# Patient Record
Sex: Female | Born: 1945 | Race: White | Hispanic: No | Smoking: Never smoker
Health system: Southern US, Community
[De-identification: ages and names within clinical notes are randomized; demographics above are authoritative.]

## PROBLEM LIST (undated history)

## (undated) DIAGNOSIS — I10 Essential (primary) hypertension: Secondary | ICD-10-CM

## (undated) DIAGNOSIS — E669 Obesity, unspecified: Secondary | ICD-10-CM

---

## 2014-11-15 ENCOUNTER — Other Ambulatory Visit: Payer: Self-pay | Admitting: Internal Medicine

## 2014-11-15 DIAGNOSIS — Z1231 Encounter for screening mammogram for malignant neoplasm of breast: Secondary | ICD-10-CM

## 2015-01-25 ENCOUNTER — Ambulatory Visit
Admission: RE | Admit: 2015-01-25 | Discharge: 2015-01-25 | Disposition: A | Discharge status: Home / Self Care | Payer: Medicare Other | Source: Ambulatory Visit | Attending: Internal Medicine | Admitting: Internal Medicine

## 2015-01-25 DIAGNOSIS — Z1231 Encounter for screening mammogram for malignant neoplasm of breast: Secondary | ICD-10-CM

## 2015-09-24 ENCOUNTER — Encounter (HOSPITAL_COMMUNITY): Payer: Self-pay | Admitting: *Deleted

## 2015-09-24 ENCOUNTER — Emergency Department (HOSPITAL_COMMUNITY)
Admission: EM | Admit: 2015-09-24 | Discharge: 2015-09-24 | Disposition: A | Discharge status: Home / Self Care | Payer: Medicare Other | Attending: Emergency Medicine | Admitting: Emergency Medicine

## 2015-09-24 DIAGNOSIS — I10 Essential (primary) hypertension: Secondary | ICD-10-CM | POA: Insufficient documentation

## 2015-09-24 DIAGNOSIS — M79671 Pain in right foot: Secondary | ICD-10-CM | POA: Diagnosis not present

## 2015-09-24 HISTORY — DX: Essential (primary) hypertension: I10

## 2015-09-24 HISTORY — DX: Obesity, unspecified: E66.9

## 2015-09-24 MED ORDER — NAPROXEN 375 MG PO TABS: 375.0000 mg | ORAL_TABLET | Freq: Two times a day (BID) | ORAL | Status: AC | PRN

## 2015-09-24 MED ORDER — KETOROLAC TROMETHAMINE 60 MG/2ML IM SOLN
30.0000 mg | Freq: Once | INTRAMUSCULAR | Status: AC
  Administered 2015-09-24: 30 mg via INTRAMUSCULAR
  Filled 2015-09-24: qty 2

## 2015-09-24 NOTE — ED Notes (Signed)
Pt reports right foot and ankle pain for over a year. Denies injury. No obv swelling or redness noted.

## 2015-09-24 NOTE — ED Notes (Signed)
Declined W/C at D/C and was escorted to lobby by RN. 

## 2015-09-24 NOTE — Discharge Instructions (Signed)
You have been seen today for foot pain. Follow-up with orthopedics as soon as possible. Call the number provided to set up an appointment. Use the postop shoe as needed for comfort. Use naproxen or ibuprofen for pain and inflammation. Follow up with PCP as needed.

## 2015-09-24 NOTE — ED Provider Notes (Signed)
CSN: [id number]     Arrival date & time 09/24/15  1603 History  By signing my name below, I, Wendy Hull, attest that this documentation has been prepared under the direction and in the presence of Quintyn Dombek, PA-C. Electronically Signed: Phillis HaggisGabriella Hull, ED Scribe. 09/24/2015. 4:27 PM.   Chief Complaint  Patient presents with  . Foot Pain   The history is provided by the patient. No language interpreter was used.  HPI Comments: Wendy Hull is a 70 y.o. female with a hx of arthritis and HTN who presents to the Emergency Department complaining of gradually worsening right foot pain onset one week ago. She reports worsening pain with weightbearing and palpation. Pt has a hx of similar symptoms occurring over the last year. She was diagnosed with arthritis and told to use Tylenol/Ibuprofen. She states that neither medication has brought her relief. Pt has not seen an orthopedist. She is regularly ambulatory with a cane. She denies hx of DM, hx of kidney disease, injury to the area, joint swelling, color change, wound, neuro deficits, or any other complaints.   Past Medical History  Diagnosis Date  . Obesity   . Hypertension    History reviewed. No pertinent past surgical history. History reviewed. No pertinent family history. Social History  Substance Use Topics  . Smoking status: Never Smoker   . Smokeless tobacco: None  . Alcohol Use: No   OB History    No data available     Review of Systems  Musculoskeletal: Positive for arthralgias. Negative for joint swelling.  Skin: Negative for color change and wound.  Neurological: Negative for weakness and numbness.   Allergies  Penicillins and Tetanus toxoids  Home Medications   Prior to Admission medications   Medication Sig Start Date End Date Taking? Authorizing Provider  naproxen (NAPROSYN) 375 MG tablet Take 1 tablet (375 mg total) by mouth 2 (two) times daily as needed for mild pain or moderate pain. 09/24/15   Shreeya Recendiz C Rulon Abdalla,  PA-C   BP 127/77 mmHg  Pulse 108  Temp(Src) 98.4 F (36.9 C) (Oral)  Resp 22  SpO2 97% Physical Exam  Constitutional: She is oriented to person, place, and time. She appears well-developed and well-nourished. No distress.  HENT:  Head: Normocephalic and atraumatic.  Eyes: Conjunctivae are normal.  Neck: Normal range of motion. Neck supple.  Cardiovascular: Normal rate, regular rhythm and intact distal pulses.   Pulmonary/Chest: Effort normal.  Musculoskeletal: Normal range of motion. She exhibits tenderness. She exhibits no edema.  Tenderness to the right lateral mid foot and plantar surface midfoot fascia. No discernable swelling or deformity.  Neurological: She is alert and oriented to person, place, and time.  No sensory deficits. Strength 5/5 in all extremities. No gait disturbance beyond pt's normal (patient uses a cane at baseline). Coordination intact.   Skin: Skin is warm and dry. She is not diaphoretic.  Psychiatric: She has a normal mood and affect. Her behavior is normal.  Nursing note and vitals reviewed.   ED Course  Procedures (including critical care time) DIAGNOSTIC STUDIES: Oxygen Saturation is 97% on RA, normal by my interpretation.    COORDINATION OF CARE: 4:26 PM-Discussed treatment plan which includes anti-inflammatories with pt at bedside and pt agreed to plan.     MDM    Final diagnoses:  Right foot pain    Wendy Hull presents with chronic right foot pain recurring over the last year.  Suspect arthritis versus plantar fasciitis. I believe weight plays a  large factor in this patient's discomfort. Pain managed in ED. Pt advised to follow up with orthopedics if symptoms persist. No indication for emergent imaging. Pt will be given post-op shoe for ambulation. Conservative therapy and anti-inflammatories recommended and discussed. Patient will be dc home & is agreeable with above plan.  Filed Vitals:   09/24/15 1608 09/24/15 1657  BP: 127/77  136/76  Pulse: 108 84  Temp: 98.4 F (36.9 C) 98.8 F (37.1 C)  TempSrc: Oral Oral  Resp: 22 18  SpO2: 97% 100%     I personally performed the services described in this documentation, which was scribed in my presence. The recorded information has been reviewed and is accurate.   Anselm PancoastShawn C Davita Sublett, PA-C 09/24/15 1700  Glynn OctaveStephen Rancour, MD 09/24/15 [phone]

## 2015-12-29 ENCOUNTER — Other Ambulatory Visit: Payer: Self-pay | Admitting: Internal Medicine

## 2015-12-29 DIAGNOSIS — Z1231 Encounter for screening mammogram for malignant neoplasm of breast: Secondary | ICD-10-CM

## 2016-01-14 ENCOUNTER — Encounter (HOSPITAL_COMMUNITY): Payer: Self-pay

## 2016-01-14 ENCOUNTER — Emergency Department (HOSPITAL_COMMUNITY)
Admission: EM | Admit: 2016-01-14 | Discharge: 2016-01-14 | Disposition: A | Discharge status: Home / Self Care | Payer: Medicare Other | Attending: Emergency Medicine | Admitting: Emergency Medicine

## 2016-01-14 ENCOUNTER — Emergency Department (HOSPITAL_COMMUNITY): Payer: Medicare Other

## 2016-01-14 DIAGNOSIS — I10 Essential (primary) hypertension: Secondary | ICD-10-CM | POA: Insufficient documentation

## 2016-01-14 DIAGNOSIS — M79671 Pain in right foot: Secondary | ICD-10-CM | POA: Insufficient documentation

## 2016-01-14 MED ORDER — IBUPROFEN 600 MG PO TABS: 600.0000 mg | ORAL_TABLET | Freq: Four times a day (QID) | ORAL | 0 refills | Status: AC | PRN

## 2016-01-14 NOTE — ED Notes (Signed)
Declined W/C at D/C and was escorted to lobby by RN. 

## 2016-01-14 NOTE — ED Triage Notes (Signed)
Pt reports right foot pain X month. Pt reports unable to walk on the foot. Pt not diabetic. Strong pedal pulse and no swelling noted.

## 2016-01-14 NOTE — Discharge Instructions (Signed)
Take your medication as prescribed as have for pain relief. I also recommend resting, elevating and applying ice to her right foot for 15 minutes 3-4 times daily for additional pain relief. 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 Please return to the Emergency Department if symptoms worsen or new onset of fever, redness, swelling, warmth, numbness, tingling, weakness, leg swelling, unable to ambulate.

## 2016-01-14 NOTE — ED Provider Notes (Signed)
[facility]-EMERGENCY DEPT Provider Note   CSN: [id number] Arrival date & time: 01/14/16  1220    By signing my name below, I, Clarisse Gouge, attest that this documentation has been prepared under the direction and in the presence of Melburn Hake, [place]. Electronically Signed: Clarisse Gouge, Scribe. 01/14/16. 3:38 PM.  History   Chief Complaint Chief Complaint  Patient presents with  . Foot Pain   The history is provided by the patient. No language interpreter was used.   HPI Comments: Wendy Hull is a 70 y.o. female with a PMHx of HTN who presents to the Emergency Department complaining of intermittent lateral right foot pain x 3-4 months. She states the pain worsened 2 weeks ago But denies any recent fall or reported injury. She was seen in the ED 2 months ago for similar symptoms and d/c home with naproxen. She denies injury to the foot or swelling. She has used Tylenol without relief. Denies fever, redness, warmth, numbness, tingling, weakness. Denies use of anticoagulants.  Past Medical History:  Diagnosis Date  . Hypertension   . Obesity     There are no active problems to display for this patient.   History reviewed. No pertinent surgical history.  OB History    No data available       Home Medications    Prior to Admission medications   Medication Sig Start Date End Date Taking? Authorizing Provider  ibuprofen (ADVIL,MOTRIN) 600 MG tablet Take 1 tablet (600 mg total) by mouth every 6 (six) hours as needed. 01/14/16   Barrett Henle, PA-C  naproxen (NAPROSYN) 375 MG tablet Take 1 tablet (375 mg total) by mouth 2 (two) times daily as needed for mild pain or moderate pain. 09/24/15   Anselm Pancoast, PA-C    Family History History reviewed. No pertinent family history.  Social History Social History  Substance Use Topics  . Smoking status: Never Smoker  . Smokeless tobacco: Never Used  . Alcohol use No     Allergies   Penicillins and Tetanus  toxoids   Review of Systems Review of Systems  Musculoskeletal: Positive for arthralgias. Negative for joint swelling.  Neurological: Negative for numbness.     Physical Exam Updated Vital Signs BP 156/80 (BP Location: Left Arm)   Pulse 104   Temp 97.8 F (36.6 C) (Oral)   Resp 17   Ht 5\' 3"  (1.6 m)   Wt 122.5 kg   SpO2 97%   BMI 47.83 kg/m   Physical Exam  Constitutional: She is oriented to person, place, and time. She appears well-developed and well-nourished.  Morbidly obese female  HENT:  Head: Normocephalic and atraumatic.  Eyes: Conjunctivae and EOM are normal. Right eye exhibits no discharge. Left eye exhibits no discharge. No scleral icterus.  Neck: Normal range of motion. Neck supple.  Cardiovascular: Normal rate and intact distal pulses.   HR 88  Pulmonary/Chest: Effort normal.  Musculoskeletal: Normal range of motion. She exhibits tenderness. She exhibits no edema or deformity.       Right foot: There is tenderness. There is normal range of motion, no swelling, normal capillary refill, no crepitus, no deformity and no laceration.       Feet:  TTP over right 5th metatarsals without swelling, erythema, or warmth. Full ROM of right toes, foot, ankle, and knee. 5/5 strength. Sensation grossly intact. Capillary refill < 2. D/P and P/T Pulses 2+. Pt able to ambulate with use of cane, which she uses at baseline.  Neurological:  She is alert and oriented to person, place, and time.  Skin: Skin is warm and dry. Capillary refill takes less than 2 seconds.  Nursing note and vitals reviewed.    ED Treatments / Results  DIAGNOSTIC STUDIES: Oxygen Saturation is 97% on RA, normal by my interpretation.    COORDINATION OF CARE: 3:39 PM Will order antiinflammatory medication. Discussed treatment plan with pt at bedside and pt agreed to plan.   Labs (all labs ordered are listed, but only abnormal results are displayed) Labs Reviewed - No data to display  EKG  EKG  Interpretation None       Radiology Dg Foot Complete Right  Result Date: 01/14/2016 CLINICAL DATA:  Pt states constant aching pain on right foot off and on x 1 week. No known injury. Pt says pain has gotten worse x yesterday. Most pain on the lateral side of right foot. Unable to bear much weight. EXAM: RIGHT FOOT COMPLETE - 3+ VIEW COMPARISON:  None. FINDINGS: There are findings suggestive of erosive osteoarthritis at the fifth PIP joint, fairly severe in degree, with associated lateral subluxation of the distal phalanx. No periarticular erosions elsewhere to suggest an inflammatory arthritis. Remainder of the interphalangeal and MTP joint spaces are relatively well preserved. Additional mild degenerative change noted at the second through fifth TMT joints. Additional changes of degenerative osteoarthritis noted at the tibiotalar joint space, at least moderate in degree. No acute appearing osseous abnormality. No evidence of acute fracture or acute subluxation. Adjacent soft tissues are unremarkable. Incidental note made of chronic spurring along the plantar and dorsal margins of the posterior calcaneus. IMPRESSION: 1. Degenerative changes, as detailed above. 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 2. No acute findings. Electronically Signed   By: Bary Richard M.D.   On: 01/14/2016 13:54    Procedures Procedures (including critical care time)  Medications Ordered in ED Medications - No data to display   Initial Impression / Assessment and Plan / ED Course  I have reviewed the triage vital signs and the nursing notes.  Pertinent labs & imaging results that were available during my care of the patient were reviewed by me and considered in my medical decision making (see chart for details).  Clinical Course    Patient resents with right foot pain which she has been  present for the past few months. Denies any fall or recent injury. Denies fever, redness, swelling, numbness, weakness. VSS. Exam revealed tenderness over lateral aspect of right forefoot. Right foot x-ray revealed degenerative changes suggesting erosive osteoarthritis at fifth PIP joint with associated lateral subluxation of distal phalanx, no acute findings. Chart review shows patient was seen in the ED on 09/24/15 for similar symptoms and was discharged home with naproxen for suspected arthritis. Discussed results and plan for discharge with patient. Plan discharge patient home with NSAIDs treatment. Patient given information to follow-up with podiatry/orthopedics for further management of chronic pain associated with her arthritis. Discussed return precautions.  Final Clinical Impressions(s) / ED Diagnoses   Final diagnoses:  Right foot pain    New Prescriptions New Prescriptions   IBUPROFEN (ADVIL,MOTRIN) 600 MG TABLET    Take 1 tablet (600 mg total) by mouth every 6 (six) hours as needed.    I personally performed the services described in this documentation, which was scribed in my presence. The recorded information has been reviewed and is accurate.  Satira Sarkicole Elizabeth CalmarNadeau, [place] JerseyPA-C 01/14/16 1600    Loren Raceravid Yelverton, MD 01/15/16 [phone]

## 2016-01-26 ENCOUNTER — Ambulatory Visit
Admission: RE | Admit: 2016-01-26 | Discharge: 2016-01-26 | Disposition: A | Discharge status: Home / Self Care | Payer: Medicare Other | Source: Ambulatory Visit | Attending: Internal Medicine | Admitting: Internal Medicine

## 2016-01-26 DIAGNOSIS — Z1231 Encounter for screening mammogram for malignant neoplasm of breast: Secondary | ICD-10-CM

## 2016-02-01 ENCOUNTER — Other Ambulatory Visit: Payer: Self-pay | Admitting: Internal Medicine

## 2016-02-01 DIAGNOSIS — R928 Other abnormal and inconclusive findings on diagnostic imaging of breast: Secondary | ICD-10-CM

## 2016-02-07 ENCOUNTER — Ambulatory Visit
Admission: RE | Admit: 2016-02-07 | Discharge: 2016-02-07 | Disposition: A | Discharge status: Home / Self Care | Payer: Medicare Other | Source: Ambulatory Visit | Attending: Internal Medicine | Admitting: Internal Medicine

## 2016-02-07 DIAGNOSIS — R928 Other abnormal and inconclusive findings on diagnostic imaging of breast: Secondary | ICD-10-CM

## 2017-02-19 IMAGING — DX DG FOOT COMPLETE 3+V*R*
3 series · 3 of 3 positions shown · non-contrast
Comparison: None.

CLINICAL DATA: Pt states constant aching pain on right foot off and
on x 1 week. No known injury. Pt says pain has gotten worse x
yesterday. Most pain on the lateral side of right foot. Unable to
bear much weight.

EXAM:
RIGHT FOOT COMPLETE - 3+ VIEW

[x foot ap right]
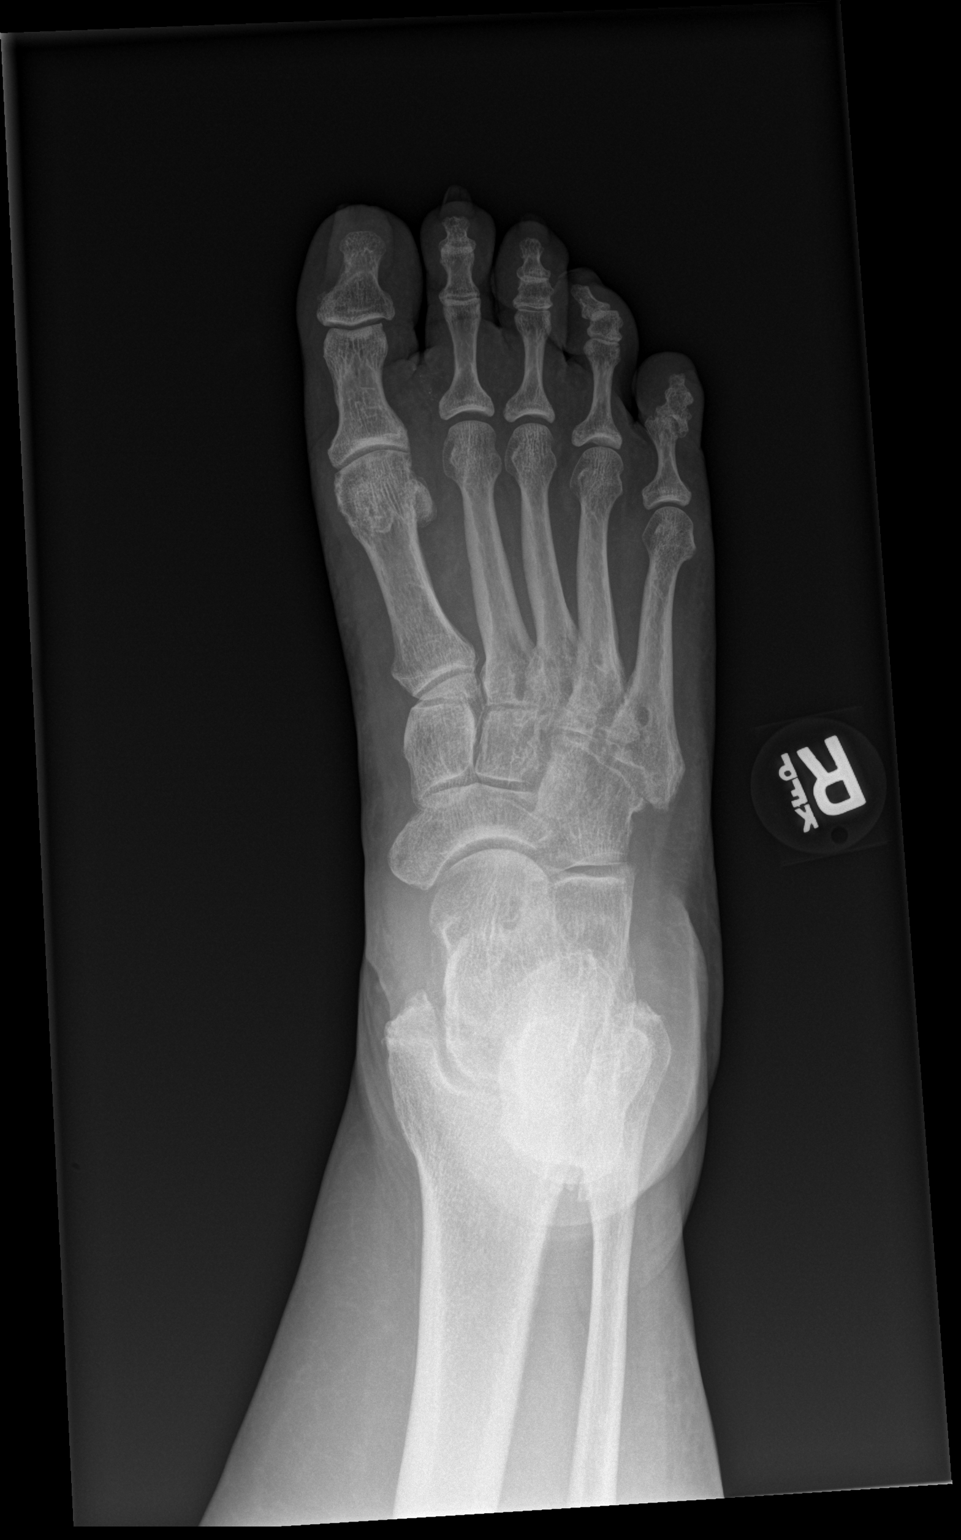

[x foot obl right]
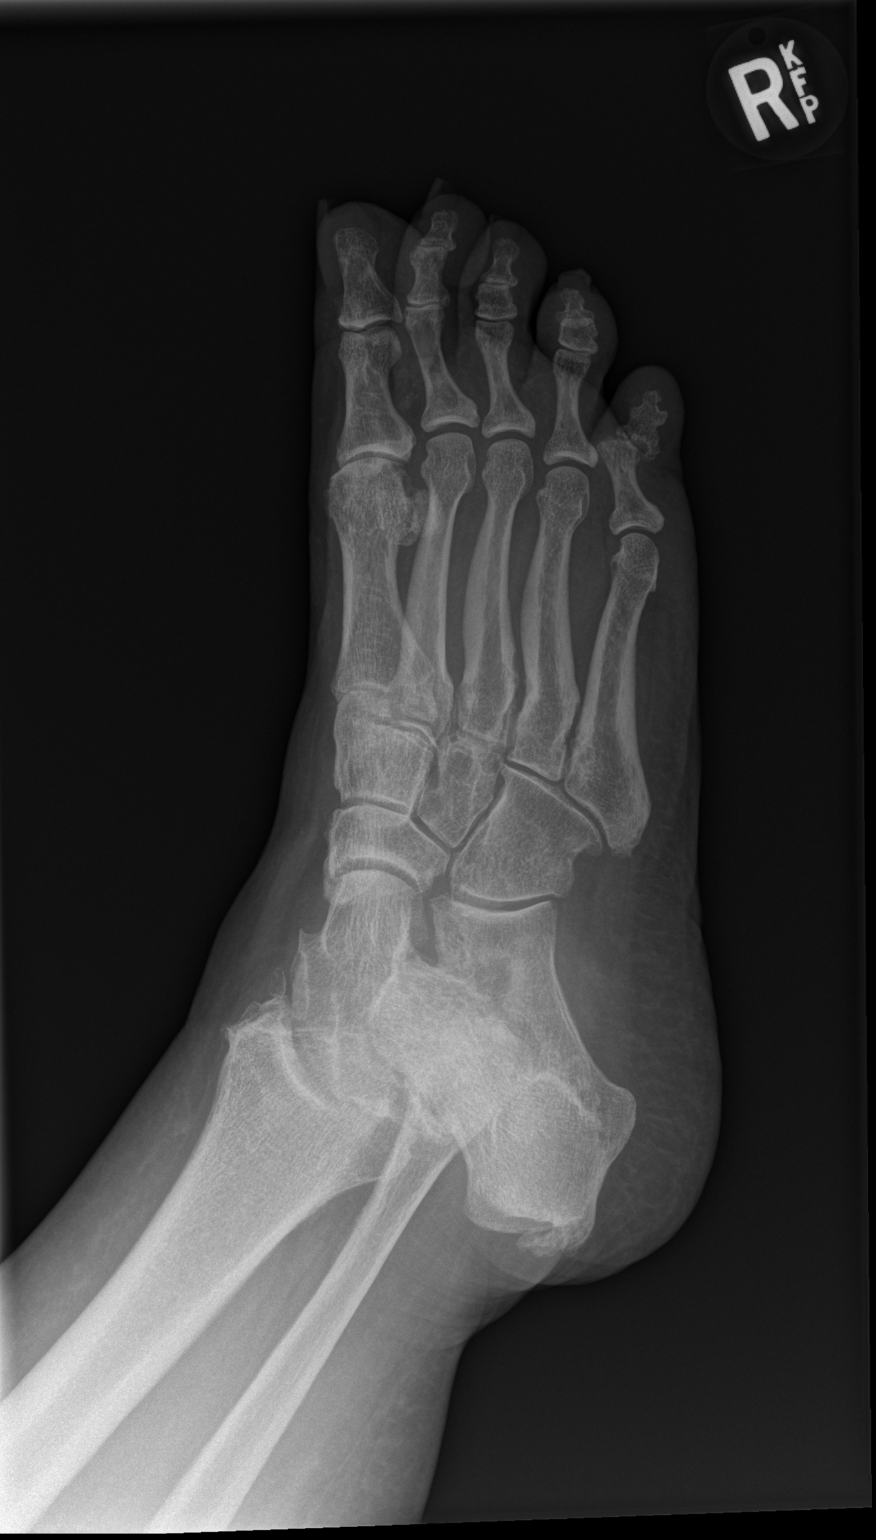

[x foot lat right]
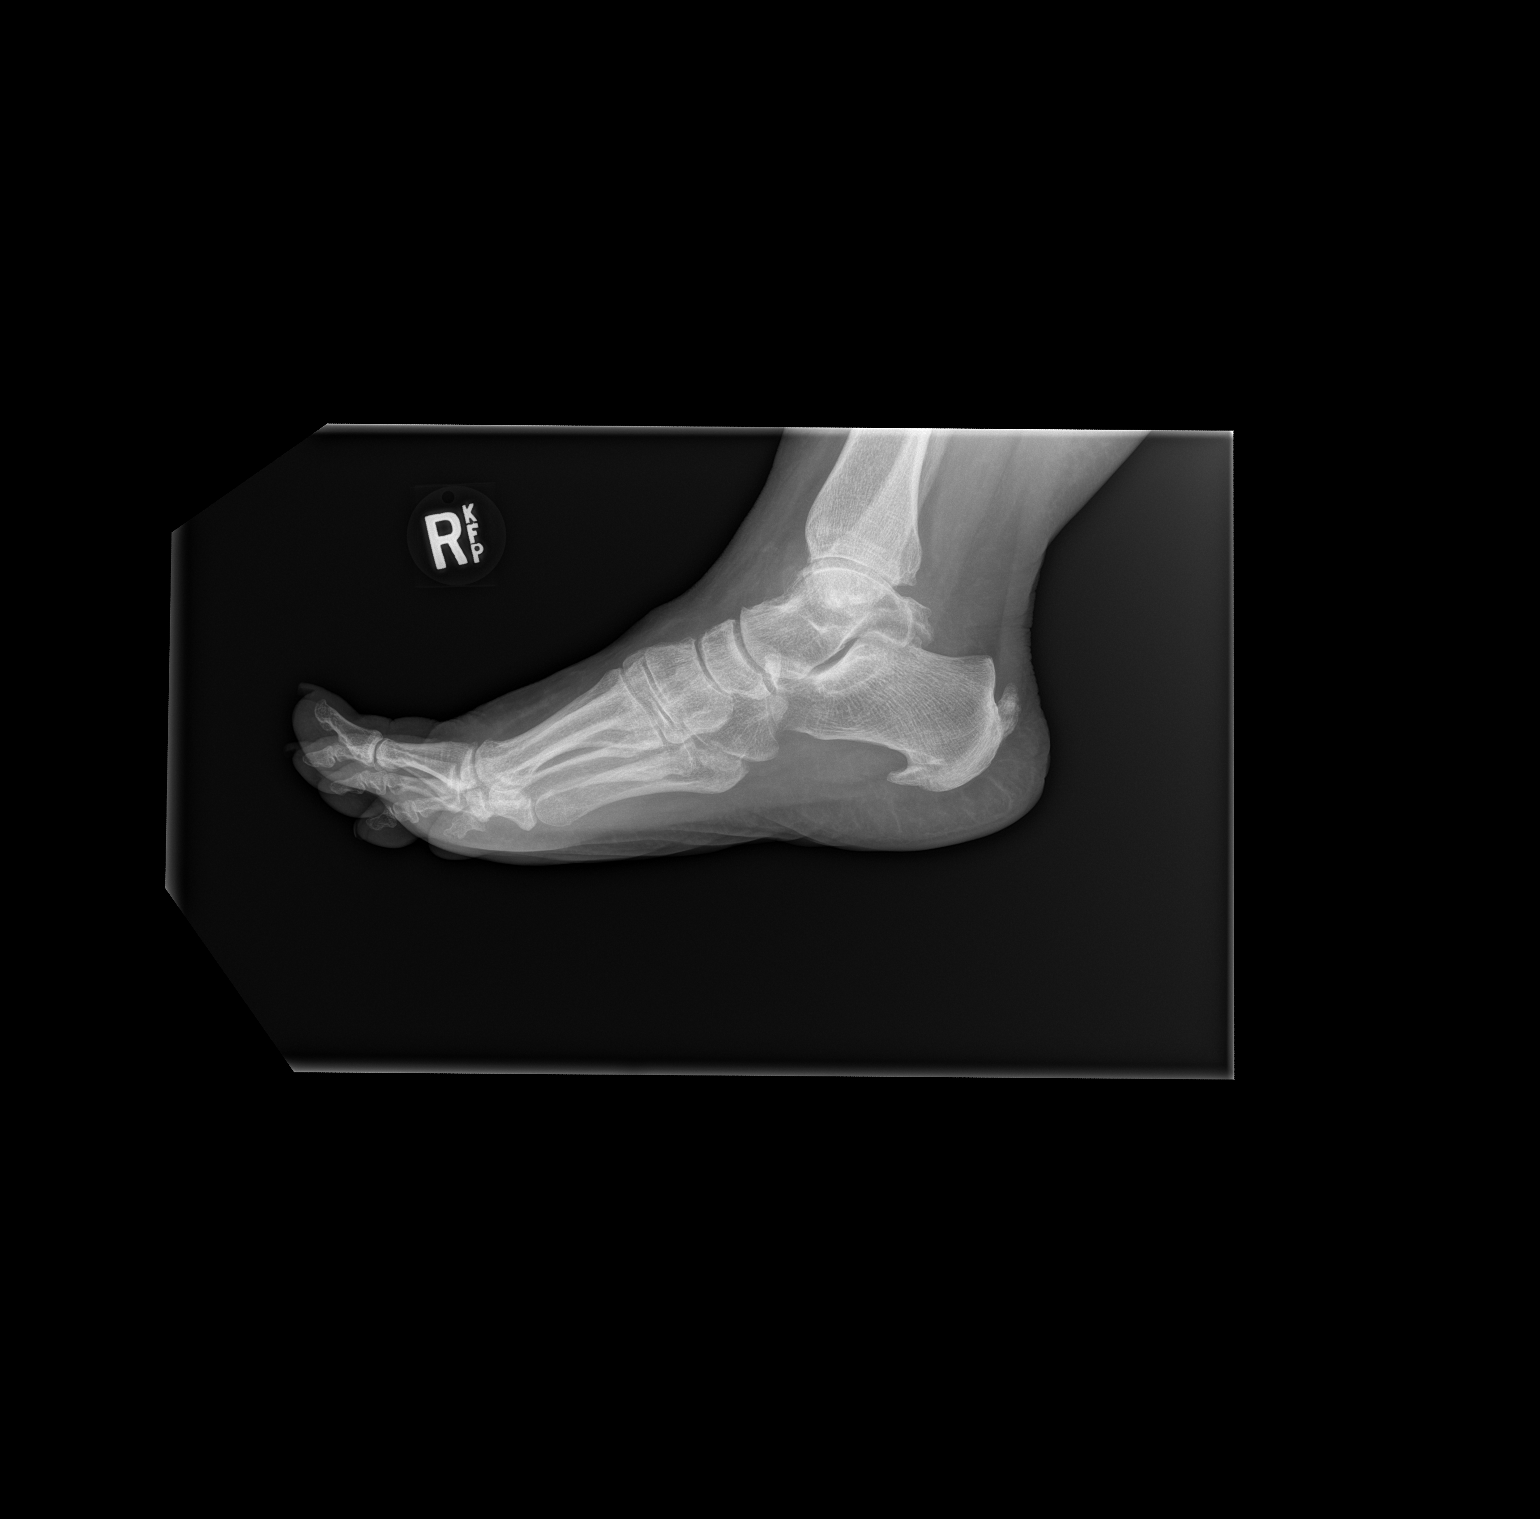

[3 of 3 positions shown; findings below may reference images not displayed]

FINDINGS: There are findings suggestive of erosive osteoarthritis at the fifth
PIP joint, fairly severe in degree, with associated lateral
subluxation of the distal phalanx. No periarticular erosions
elsewhere to suggest an inflammatory arthritis. Remainder of the
interphalangeal and MTP joint spaces are relatively well preserved.

Additional mild degenerative change noted at the second through
fifth TMT joints. Additional changes of degenerative osteoarthritis
noted at the tibiotalar joint space, at least moderate in degree.

No acute appearing osseous abnormality. No evidence of acute
fracture or acute subluxation. Adjacent soft tissues are
unremarkable. Incidental note made of chronic spurring along the
plantar and dorsal margins of the posterior calcaneus.
IMPRESSION: 1. Degenerative changes, as detailed above. This includes presumed
chronic erosive osteoarthritis at the fifth PIP joint, fairly severe
in degree, with associated lateral subluxation of the distal
phalanx.
2. No acute findings.
